# Patient Record
Sex: Female | Born: 1950 | Race: White | Marital: Single | Smoking: Never smoker
Health system: Southern US, Community
[De-identification: ages and names within clinical notes are randomized; demographics above are authoritative.]

---

## 2021-04-22 ENCOUNTER — Other Ambulatory Visit: Payer: Self-pay | Admitting: Internal Medicine

## 2021-04-22 DIAGNOSIS — E785 Hyperlipidemia, unspecified: Secondary | ICD-10-CM

## 2021-05-15 ENCOUNTER — Other Ambulatory Visit: Payer: Self-pay

## 2021-05-15 ENCOUNTER — Ambulatory Visit
Admission: RE | Admit: 2021-05-15 | Discharge: 2021-05-15 | Disposition: A | Discharge status: Home / Self Care | Payer: No Typology Code available for payment source | Source: Ambulatory Visit | Attending: Internal Medicine | Admitting: Internal Medicine

## 2021-05-15 DIAGNOSIS — E785 Hyperlipidemia, unspecified: Secondary | ICD-10-CM

## 2021-05-30 ENCOUNTER — Emergency Department (HOSPITAL_BASED_OUTPATIENT_CLINIC_OR_DEPARTMENT_OTHER): Payer: Medicare Other

## 2021-05-30 ENCOUNTER — Encounter (HOSPITAL_BASED_OUTPATIENT_CLINIC_OR_DEPARTMENT_OTHER): Payer: Self-pay | Admitting: Emergency Medicine

## 2021-05-30 ENCOUNTER — Emergency Department (HOSPITAL_BASED_OUTPATIENT_CLINIC_OR_DEPARTMENT_OTHER)
Admission: EM | Admit: 2021-05-30 | Discharge: 2021-05-31 | Disposition: A | Discharge status: Home / Self Care | Payer: Medicare Other | Attending: Emergency Medicine | Admitting: Emergency Medicine

## 2021-05-30 ENCOUNTER — Other Ambulatory Visit: Payer: Self-pay

## 2021-05-30 DIAGNOSIS — N130 Hydronephrosis with ureteropelvic junction obstruction: Secondary | ICD-10-CM | POA: Diagnosis not present

## 2021-05-30 DIAGNOSIS — K922 Gastrointestinal hemorrhage, unspecified: Secondary | ICD-10-CM

## 2021-05-30 DIAGNOSIS — R195 Other fecal abnormalities: Secondary | ICD-10-CM | POA: Diagnosis present

## 2021-05-30 DIAGNOSIS — R1084 Generalized abdominal pain: Secondary | ICD-10-CM

## 2021-05-30 DIAGNOSIS — R300 Dysuria: Secondary | ICD-10-CM

## 2021-05-30 DIAGNOSIS — N135 Crossing vessel and stricture of ureter without hydronephrosis: Secondary | ICD-10-CM

## 2021-05-30 LAB — BASIC METABOLIC PANEL
Anion gap: 5 (ref 5–15)
BUN: 17 mg/dL (ref 8–23)
CO2: 31 mmol/L (ref 22–32)
Calcium: 9.4 mg/dL (ref 8.9–10.3)
Chloride: 103 mmol/L (ref 98–111)
Creatinine, Ser: 0.88 mg/dL (ref 0.44–1.00)
GFR, Estimated: >60 mL/min (ref >60)
Glucose, Bld: 79 mg/dL (ref 70–99)
Potassium: 4.2 mmol/L (ref 3.5–5.1)
Sodium: 139 mmol/L (ref 135–145)

## 2021-05-30 LAB — URINALYSIS, ROUTINE W REFLEX MICROSCOPIC
Bilirubin Urine: NEGATIVE
Glucose, UA: NEGATIVE mg/dL
Hgb urine dipstick: NEGATIVE
Ketones, ur: NEGATIVE mg/dL
Leukocytes,Ua: NEGATIVE
Nitrite: NEGATIVE
Protein, ur: NEGATIVE mg/dL
Specific Gravity, Urine: 1.009 (ref 1.005–1.030)
pH: 7 (ref 5.0–8.0)

## 2021-05-30 LAB — CBC WITH DIFFERENTIAL/PLATELET
Abs Immature Granulocytes: 0 10*3/uL (ref 0.00–0.07)
Basophils Absolute: 0 10*3/uL (ref 0.0–0.1)
Basophils Relative: 1 %
Eosinophils Absolute: 0.2 10*3/uL (ref 0.0–0.5)
Eosinophils Relative: 3 %
HCT: 38.6 % (ref 36.0–46.0)
Hemoglobin: 13.1 g/dL (ref 12.0–15.0)
Immature Granulocytes: 0 %
Lymphocytes Relative: 45 %
Lymphs Abs: 2.4 10*3/uL (ref 0.7–4.0)
MCH: 31.3 pg (ref 26.0–34.0)
MCHC: 33.9 g/dL (ref 30.0–36.0)
MCV: 92.1 fL (ref 80.0–100.0)
Monocytes Absolute: 0.4 10*3/uL (ref 0.1–1.0)
Monocytes Relative: 7 %
Neutro Abs: 2.3 10*3/uL (ref 1.7–7.7)
Neutrophils Relative %: 44 %
Platelets: 218 10*3/uL (ref 150–400)
RBC: 4.19 MIL/uL (ref 3.87–5.11)
RDW: 12.5 % (ref 11.5–15.5)
WBC: 5.3 10*3/uL (ref 4.0–10.5)
nRBC: 0 % (ref 0.0–0.2)

## 2021-05-30 LAB — OCCULT BLOOD X 1 CARD TO LAB, STOOL: Fecal Occult Bld: NEGATIVE

## 2021-05-30 NOTE — ED Provider Notes (Signed)
11:50 PM Assumed care from Dr. Deretha Emory, please see their note for full history, physical and decision making until this point. In brief this is a 70 y.o. year old female who presented to the ED tonight with Blood In Stools     Possibly mucousy bloody stool at home. Plan for CT. Hb stable. POC pending. Likely discharge unless ct super abnormal.   CT reassuring from GI standpoint. 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 Will refer to PCP (message sent in Epic) for further workup of this and management. Kidneys working fine. Urine clear, no indication for emergent [place] or urology consult at this time.   Discharge instructions, including strict return precautions for new or worsening symptoms, given. Patient and/or family verbalized understanding and agreement with the plan as described.   Labs, studies and imaging reviewed by myself and considered in medical decision making if ordered. Imaging interpreted by radiology.  Labs Reviewed  URINALYSIS, ROUTINE W REFLEX MICROSCOPIC - Abnormal; Notable for the following components:      Result Value   Color, Urine COLORLESS (*)    All other components within normal limits  CBC WITH DIFFERENTIAL/PLATELET  BASIC METABOLIC PANEL  HEPATIC FUNCTION PANEL  LIPASE, BLOOD  OCCULT BLOOD X 1 CARD TO LAB, STOOL    CT Abdomen Pelvis W Contrast    (Results Pending)    No follow-ups on file.    Jasmine Memos, MD 05/31/21 [phone]

## 2021-05-30 NOTE — ED Notes (Signed)
Pt declined to change into a gown a this time for this RN to complete her assessment. Pt does not want a rectal exam done because she brought a sample of her stool and does not understand why we cannot use that sample, this RN explained the protocol and the patient verbalized understanding.

## 2021-05-30 NOTE — ED Triage Notes (Signed)
Pt arrives pov with referral from PCP with c/o dark stool x 2 days and dysuria

## 2021-05-30 NOTE — ED Provider Notes (Signed)
[facility] EMERGENCY DEPT Provider Note   CSN: [id number] Arrival date & time: 05/30/21  1447     History Chief Complaint  Patient presents with   Blood In Stools    Jasmine Hughes is a 70 y.o. female.  Patient new to the area followed by [facility]  Patient states stating yesterday started having black mucousy and red stool.  Some of it would disperse into the toilet water but cold water would not turn all red.  Patient also had some red blood on wiping.  Patient brought stool sample in.  No gross melena or gross blood noted.  Sent for Hemoccult.  Patient without any distinct abdominal discomfort.  But does have a history of reflux and hiatal hernia.  But patient's never had blood in bowel movements before.  Certainly never had mucousy bowel movements before.  Denies any nausea vomiting or diarrhea no fevers.  And also concerned about urinary tract infection she has dysuria.  Patient states she last had a colonoscopy 5 years ago.  She was not living here at that time.  Does not have a gastroenterologist locally.      History reviewed. No pertinent past medical history.  There are no problems to display for this patient.   History reviewed. No pertinent surgical history.   OB History   No obstetric history on file.     History reviewed. No pertinent family history.  Social History   Tobacco Use   Smoking status: Never  Vaping Use   Vaping Use: Never used  Substance Use Topics   Alcohol use: Yes    Comment: occ    Home Medications Prior to Admission medications   Not on File    Allergies    Diphenhydramine, Diphenhydramine hcl, and Amoxicillin  Review of Systems   Review of Systems  Constitutional:  Negative for chills and fever.  HENT:  Negative for ear pain and sore throat.   Eyes:  Negative for pain and visual disturbance.  Respiratory:  Negative for cough and shortness of breath.   Cardiovascular:  Negative for chest pain  and palpitations.  Gastrointestinal:  Positive for abdominal pain and blood in stool. Negative for diarrhea, nausea and vomiting.  Genitourinary:  Positive for dysuria. Negative for hematuria.  Musculoskeletal:  Negative for arthralgias and back pain.  Skin:  Negative for color change and rash.  Neurological:  Negative for seizures and syncope.  All other systems reviewed and are negative.  Physical Exam Updated Vital Signs BP 136/80    Pulse 61    Temp 98.5 F (36.9 C) (Oral)    Resp 20    Ht 1.6 m (5\' 3" )    Wt 68 kg    SpO2 96%    BMI 26.57 kg/m   Physical Exam Vitals and nursing note reviewed.  Constitutional:      General: She is not in acute distress.    Appearance: Normal appearance. She is well-developed.  HENT:     Head: Normocephalic and atraumatic.  Eyes:     Extraocular Movements: Extraocular movements intact.     Conjunctiva/sclera: Conjunctivae normal.     Pupils: Pupils are equal, round, and reactive to light.  Cardiovascular:     Rate and Rhythm: Normal rate and regular rhythm.     Heart sounds: No murmur heard. Pulmonary:     Effort: Pulmonary effort is normal. No respiratory distress.     Breath sounds: Normal breath sounds.  Abdominal:  General: There is no distension.     Palpations: Abdomen is soft.     Tenderness: There is no abdominal tenderness. There is no guarding.  Musculoskeletal:        General: No swelling.     Cervical back: Neck supple.  Skin:    General: Skin is warm and dry.     Capillary Refill: Capillary refill takes less than 2 seconds.  Neurological:     Mental Status: She is alert.  Psychiatric:        Mood and Affect: Mood normal.    ED Results / Procedures / Treatments   Labs (all labs ordered are listed, but only abnormal results are displayed) Labs Reviewed  URINALYSIS, ROUTINE W REFLEX MICROSCOPIC - Abnormal; Notable for the following components:      Result Value   Color, Urine COLORLESS (*)    All other components  within normal limits  CBC WITH DIFFERENTIAL/PLATELET  BASIC METABOLIC PANEL  HEPATIC FUNCTION PANEL  LIPASE, BLOOD  POC OCCULT BLOOD, ED    EKG None  Radiology No results found.  Procedures Procedures   Medications Ordered in ED Medications - No data to display  ED Course  I have reviewed the triage vital signs and the nursing notes.  Pertinent labs & imaging results that were available during my care of the patient were reviewed by me and considered in my medical decision making (see chart for details).    MDM Rules/Calculators/A&P                          Patient's vital signs here are very reassuring not tachycardic no hypotension.  The stool that is available here is dark brown.  No gross blood.  But Hemoccult is pending.  Urinalysis is normal.  No evidence of urinary tract infection.  No leukocytosis on the CBC hemoglobin is 13.1.  ASIC metabolic panel is normal.  We will add on hepatic and lipase because of her symptoms.  Will get CT scan of her abdomen and pelvis.  If no acute findings patient can be referred for follow-up with gastroenterology.  [facility] is covering.  And also patient can follow back up with primary care provider for follow-up of hemoglobin.  At this point unless CT directs otherwise patient does not have an indication for admission.  Final Clinical Impression(s) / ED Diagnoses Final diagnoses:  Gastrointestinal hemorrhage, unspecified gastrointestinal hemorrhage type  Generalized abdominal pain  Dysuria    Rx / DC Orders ED Discharge Orders     None        Fredia Sorrow, MD 05/30/21 [phone]

## 2021-05-30 NOTE — Discharge Instructions (Addendum)
Make an appointment to follow-up with gastroenterology.  Return for water in the commode turning completely red x2 in a day.  This would be evidence of a significant bleed.  Or for any new or worse abdominal pain.  Also could follow back up with your primary care doctor next week for hemoglobin check.  Gastroenterology probably needs to consider arranging colonoscopy and may be upper endoscopy.  Your CT scan showed an obstruction at the right kidney of unclear cause. Please follow up with your PCP for further workup and management of this.

## 2021-05-31 LAB — HEPATIC FUNCTION PANEL
ALT: 13 U/L (ref 0–44)
AST: 18 U/L (ref 15–41)
Albumin: 4 g/dL (ref 3.5–5.0)
Alkaline Phosphatase: 44 U/L (ref 38–126)
Bilirubin, Direct: 0.1 mg/dL (ref 0.0–0.2)
Indirect Bilirubin: 0.5 mg/dL (ref 0.3–0.9)
Total Bilirubin: 0.6 mg/dL (ref 0.3–1.2)
Total Protein: 7 g/dL (ref 6.5–8.1)

## 2021-05-31 LAB — LIPASE, BLOOD: Lipase: 21 U/L (ref 11–51)

## 2021-05-31 MED ORDER — IOHEXOL 300 MG/ML  SOLN
100.0000 mL | Freq: Once | INTRAMUSCULAR | Status: AC | PRN
  Administered 2021-05-31: 100 mL via INTRAVENOUS

## 2021-06-23 ENCOUNTER — Other Ambulatory Visit: Payer: Self-pay | Admitting: Internal Medicine

## 2021-06-23 DIAGNOSIS — N135 Crossing vessel and stricture of ureter without hydronephrosis: Secondary | ICD-10-CM

## 2021-07-04 ENCOUNTER — Ambulatory Visit
Admission: RE | Admit: 2021-07-04 | Discharge: 2021-07-04 | Disposition: A | Discharge status: Home / Self Care | Payer: Medicare Other | Source: Ambulatory Visit | Attending: Internal Medicine | Admitting: Internal Medicine

## 2021-07-04 DIAGNOSIS — N135 Crossing vessel and stricture of ureter without hydronephrosis: Secondary | ICD-10-CM

## 2021-07-04 DIAGNOSIS — N1330 Unspecified hydronephrosis: Secondary | ICD-10-CM | POA: Diagnosis not present

## 2021-07-08 DIAGNOSIS — K921 Melena: Secondary | ICD-10-CM | POA: Diagnosis not present

## 2021-07-22 DIAGNOSIS — R87810 Cervical high risk human papillomavirus (HPV) DNA test positive: Secondary | ICD-10-CM | POA: Diagnosis not present

## 2021-08-06 DIAGNOSIS — N281 Cyst of kidney, acquired: Secondary | ICD-10-CM | POA: Diagnosis not present

## 2021-08-12 DIAGNOSIS — H524 Presbyopia: Secondary | ICD-10-CM | POA: Diagnosis not present

## 2021-08-29 DIAGNOSIS — M549 Dorsalgia, unspecified: Secondary | ICD-10-CM | POA: Diagnosis not present

## 2021-09-05 DIAGNOSIS — M9902 Segmental and somatic dysfunction of thoracic region: Secondary | ICD-10-CM | POA: Diagnosis not present

## 2021-09-05 DIAGNOSIS — M5032 Other cervical disc degeneration, mid-cervical region, unspecified level: Secondary | ICD-10-CM | POA: Diagnosis not present

## 2021-09-05 DIAGNOSIS — M9901 Segmental and somatic dysfunction of cervical region: Secondary | ICD-10-CM | POA: Diagnosis not present

## 2021-09-05 DIAGNOSIS — M47814 Spondylosis without myelopathy or radiculopathy, thoracic region: Secondary | ICD-10-CM | POA: Diagnosis not present

## 2021-09-09 DIAGNOSIS — M47814 Spondylosis without myelopathy or radiculopathy, thoracic region: Secondary | ICD-10-CM | POA: Diagnosis not present

## 2021-09-09 DIAGNOSIS — M9902 Segmental and somatic dysfunction of thoracic region: Secondary | ICD-10-CM | POA: Diagnosis not present

## 2021-09-09 DIAGNOSIS — M5032 Other cervical disc degeneration, mid-cervical region, unspecified level: Secondary | ICD-10-CM | POA: Diagnosis not present

## 2021-09-09 DIAGNOSIS — M9901 Segmental and somatic dysfunction of cervical region: Secondary | ICD-10-CM | POA: Diagnosis not present

## 2021-09-11 DIAGNOSIS — M47814 Spondylosis without myelopathy or radiculopathy, thoracic region: Secondary | ICD-10-CM | POA: Diagnosis not present

## 2021-09-11 DIAGNOSIS — M9902 Segmental and somatic dysfunction of thoracic region: Secondary | ICD-10-CM | POA: Diagnosis not present

## 2021-09-11 DIAGNOSIS — M9901 Segmental and somatic dysfunction of cervical region: Secondary | ICD-10-CM | POA: Diagnosis not present

## 2021-09-11 DIAGNOSIS — M5032 Other cervical disc degeneration, mid-cervical region, unspecified level: Secondary | ICD-10-CM | POA: Diagnosis not present

## 2021-09-15 DIAGNOSIS — M9901 Segmental and somatic dysfunction of cervical region: Secondary | ICD-10-CM | POA: Diagnosis not present

## 2021-09-15 DIAGNOSIS — M47814 Spondylosis without myelopathy or radiculopathy, thoracic region: Secondary | ICD-10-CM | POA: Diagnosis not present

## 2021-09-15 DIAGNOSIS — M5032 Other cervical disc degeneration, mid-cervical region, unspecified level: Secondary | ICD-10-CM | POA: Diagnosis not present

## 2021-09-15 DIAGNOSIS — M9902 Segmental and somatic dysfunction of thoracic region: Secondary | ICD-10-CM | POA: Diagnosis not present

## 2021-09-19 DIAGNOSIS — M9901 Segmental and somatic dysfunction of cervical region: Secondary | ICD-10-CM | POA: Diagnosis not present

## 2021-09-19 DIAGNOSIS — M9902 Segmental and somatic dysfunction of thoracic region: Secondary | ICD-10-CM | POA: Diagnosis not present

## 2021-09-19 DIAGNOSIS — M5032 Other cervical disc degeneration, mid-cervical region, unspecified level: Secondary | ICD-10-CM | POA: Diagnosis not present

## 2021-09-19 DIAGNOSIS — M47814 Spondylosis without myelopathy or radiculopathy, thoracic region: Secondary | ICD-10-CM | POA: Diagnosis not present

## 2021-09-22 DIAGNOSIS — M9901 Segmental and somatic dysfunction of cervical region: Secondary | ICD-10-CM | POA: Diagnosis not present

## 2021-09-22 DIAGNOSIS — M47814 Spondylosis without myelopathy or radiculopathy, thoracic region: Secondary | ICD-10-CM | POA: Diagnosis not present

## 2021-09-22 DIAGNOSIS — M5032 Other cervical disc degeneration, mid-cervical region, unspecified level: Secondary | ICD-10-CM | POA: Diagnosis not present

## 2021-09-22 DIAGNOSIS — M9902 Segmental and somatic dysfunction of thoracic region: Secondary | ICD-10-CM | POA: Diagnosis not present

## 2021-09-26 DIAGNOSIS — N9089 Other specified noninflammatory disorders of vulva and perineum: Secondary | ICD-10-CM | POA: Diagnosis not present

## 2021-09-26 DIAGNOSIS — A609 Anogenital herpesviral infection, unspecified: Secondary | ICD-10-CM | POA: Diagnosis not present

## 2021-10-07 DIAGNOSIS — K921 Melena: Secondary | ICD-10-CM | POA: Diagnosis not present

## 2021-10-07 DIAGNOSIS — K2930 Chronic superficial gastritis without bleeding: Secondary | ICD-10-CM | POA: Diagnosis not present

## 2021-10-07 DIAGNOSIS — K635 Polyp of colon: Secondary | ICD-10-CM | POA: Diagnosis not present

## 2021-10-07 DIAGNOSIS — K449 Diaphragmatic hernia without obstruction or gangrene: Secondary | ICD-10-CM | POA: Diagnosis not present

## 2021-10-07 DIAGNOSIS — K625 Hemorrhage of anus and rectum: Secondary | ICD-10-CM | POA: Diagnosis not present

## 2021-10-07 DIAGNOSIS — K222 Esophageal obstruction: Secondary | ICD-10-CM | POA: Diagnosis not present

## 2021-10-07 DIAGNOSIS — K5730 Diverticulosis of large intestine without perforation or abscess without bleeding: Secondary | ICD-10-CM | POA: Diagnosis not present

## 2021-10-07 DIAGNOSIS — K648 Other hemorrhoids: Secondary | ICD-10-CM | POA: Diagnosis not present

## 2021-10-07 DIAGNOSIS — K317 Polyp of stomach and duodenum: Secondary | ICD-10-CM | POA: Diagnosis not present

## 2021-10-07 DIAGNOSIS — K2970 Gastritis, unspecified, without bleeding: Secondary | ICD-10-CM | POA: Diagnosis not present

## 2021-10-10 DIAGNOSIS — K317 Polyp of stomach and duodenum: Secondary | ICD-10-CM | POA: Diagnosis not present

## 2021-10-10 DIAGNOSIS — K2930 Chronic superficial gastritis without bleeding: Secondary | ICD-10-CM | POA: Diagnosis not present

## 2021-10-10 DIAGNOSIS — K635 Polyp of colon: Secondary | ICD-10-CM | POA: Diagnosis not present

## 2021-10-23 DIAGNOSIS — M5032 Other cervical disc degeneration, mid-cervical region, unspecified level: Secondary | ICD-10-CM | POA: Diagnosis not present

## 2021-10-23 DIAGNOSIS — M47814 Spondylosis without myelopathy or radiculopathy, thoracic region: Secondary | ICD-10-CM | POA: Diagnosis not present

## 2021-10-23 DIAGNOSIS — M9902 Segmental and somatic dysfunction of thoracic region: Secondary | ICD-10-CM | POA: Diagnosis not present

## 2021-10-23 DIAGNOSIS — M9901 Segmental and somatic dysfunction of cervical region: Secondary | ICD-10-CM | POA: Diagnosis not present

## 2021-10-28 DIAGNOSIS — E785 Hyperlipidemia, unspecified: Secondary | ICD-10-CM | POA: Diagnosis not present

## 2021-10-28 DIAGNOSIS — R7989 Other specified abnormal findings of blood chemistry: Secondary | ICD-10-CM | POA: Diagnosis not present

## 2021-10-28 DIAGNOSIS — E559 Vitamin D deficiency, unspecified: Secondary | ICD-10-CM | POA: Diagnosis not present

## 2021-11-03 DIAGNOSIS — Z0000 Encounter for general adult medical examination without abnormal findings: Secondary | ICD-10-CM | POA: Diagnosis not present

## 2021-11-03 DIAGNOSIS — G4700 Insomnia, unspecified: Secondary | ICD-10-CM | POA: Diagnosis not present

## 2021-11-03 DIAGNOSIS — E559 Vitamin D deficiency, unspecified: Secondary | ICD-10-CM | POA: Diagnosis not present

## 2021-11-03 DIAGNOSIS — R82998 Other abnormal findings in urine: Secondary | ICD-10-CM | POA: Diagnosis not present

## 2021-11-03 DIAGNOSIS — E785 Hyperlipidemia, unspecified: Secondary | ICD-10-CM | POA: Diagnosis not present

## 2021-11-15 ENCOUNTER — Emergency Department (HOSPITAL_BASED_OUTPATIENT_CLINIC_OR_DEPARTMENT_OTHER)
Admission: EM | Admit: 2021-11-15 | Discharge: 2021-11-16 | Disposition: A | Discharge status: Home / Self Care | Payer: Medicare Other | Attending: Emergency Medicine | Admitting: Emergency Medicine

## 2021-11-15 ENCOUNTER — Encounter (HOSPITAL_BASED_OUTPATIENT_CLINIC_OR_DEPARTMENT_OTHER): Payer: Self-pay | Admitting: *Deleted

## 2021-11-15 ENCOUNTER — Other Ambulatory Visit: Payer: Self-pay

## 2021-11-15 DIAGNOSIS — S50862A Insect bite (nonvenomous) of left forearm, initial encounter: Secondary | ICD-10-CM | POA: Insufficient documentation

## 2021-11-15 DIAGNOSIS — W57XXXA Bitten or stung by nonvenomous insect and other nonvenomous arthropods, initial encounter: Secondary | ICD-10-CM | POA: Diagnosis not present

## 2021-11-15 DIAGNOSIS — S59912A Unspecified injury of left forearm, initial encounter: Secondary | ICD-10-CM | POA: Diagnosis present

## 2021-11-15 NOTE — ED Triage Notes (Signed)
Pt states she was bitten by something on Wednesday. Not sure what bit her. Pt has not had fever or chills. States she is allergic to benadryl. Has put ice on site. States area has gotten "bigger" since Wednesday. Redness noted to anterior aspect of her left arm. Silver dollar in size.

## 2021-11-16 MED ORDER — HYDROCORTISONE 1 % EX CREA
TOPICAL_CREAM | Freq: Three times a day (TID) | CUTANEOUS | Status: DC
  Administered 2021-11-16: 1 via TOPICAL
  Filled 2021-11-16: qty 28

## 2021-11-16 NOTE — ED Provider Notes (Signed)
   [facility] Provider Note: Lowella Dell, MD, FACEP  CSN: [id number] MRN: [id number] ARRIVAL: 11/15/21 at 2101 ROOM: DB003/DB003   CHIEF COMPLAINT  Skin Problem   HISTORY OF PRESENT ILLNESS  11/16/21 4:36 AM Jasmine Hughes is a 71 y.o. female who thinks she was bitten by something (insect?)  On her anterior left arm 4 days ago.  She has a welt at the site which has increased in size.  She has not had fever or chills with this.  It is pruritic but minimally painful.  She has had no systemic symptoms such as fever, chills, nausea, vomiting, diarrhea, shortness of breath.  She has not taken any Benadryl because she is allergic to it.   History reviewed. No pertinent past medical history.  History reviewed. No pertinent surgical history.  No family history on file.  Social History   Tobacco Use   Smoking status: Never   Smokeless tobacco: Never  Vaping Use   Vaping Use: Never used  Substance Use Topics   Alcohol use: Yes    Comment: occ   Drug use: Never    Prior to Admission medications   Not on File    Allergies Diphenhydramine, Diphenhydramine hcl, and Amoxicillin   REVIEW OF SYSTEMS  Negative except as noted here or in the History of Present Illness.   PHYSICAL EXAMINATION  Initial Vital Signs Blood pressure 118/77, pulse 67, temperature 98.3 F (36.8 C), resp. rate 16, SpO2 97 %.  Examination General: Well-developed, well-nourished female in no acute distress; appearance consistent with age of record HENT: normocephalic; atraumatic Eyes: Normal appearance Neck: supple Heart: regular rate and rhythm Lungs: clear to auscultation bilaterally Abdomen: soft; nondistended; nontender; bowel sounds present Extremities: No deformity; full range of motion; pulses normal Neurologic: Awake, alert and oriented; motor function intact in all extremities and symmetric; no facial droop Skin: Warm and dry; mildly erythematous plaque left volar  forearm:    Psychiatric: Normal mood and affect   RESULTS  Summary of this visit's results, reviewed and interpreted by myself:   EKG Interpretation  Date/Time:    Ventricular Rate:    PR Interval:    QRS Duration:   QT Interval:    QTC Calculation:   R Axis:     Text Interpretation:         Laboratory Studies: No results found for this or any previous visit (from the past 24 hour(s)). Imaging Studies: No results found.  ED COURSE and MDM  Nursing notes, initial and subsequent vitals signs, including pulse oximetry, reviewed and interpreted by myself.  Vitals:   11/15/21 2210 11/15/21 2259 11/16/21 0429  BP: 123/85 138/78 118/77  Pulse: 70 67 67  Resp: 16 18 16   Temp: 98.3 F (36.8 C)    SpO2: 97% 98% 97%   Medications  hydrocortisone cream 1 % (has no administration in time range)   Lesion consistent with an insect or arthropod bite.  We will treat with topical hydrocortisone cream.  She is having no systemic symptoms to suggest a serious reaction.   PROCEDURES  Procedures   ED DIAGNOSES     ICD-10-CM   1. Arthropod bite of forearm, left, initial encounter  .C62.376E    G31, MD 11/16/21 [phone]

## 2021-11-16 NOTE — ED Notes (Signed)
Pt verbalizes understanding of discharge instructions. Opportunity for questioning and answers were provided. Pt discharged from ED to home.   ? ?

## 2021-12-29 DIAGNOSIS — N9089 Other specified noninflammatory disorders of vulva and perineum: Secondary | ICD-10-CM | POA: Diagnosis not present

## 2021-12-29 DIAGNOSIS — A609 Anogenital herpesviral infection, unspecified: Secondary | ICD-10-CM | POA: Diagnosis not present

## 2022-01-23 DIAGNOSIS — Z1231 Encounter for screening mammogram for malignant neoplasm of breast: Secondary | ICD-10-CM | POA: Diagnosis not present

## 2022-03-09 DIAGNOSIS — H5213 Myopia, bilateral: Secondary | ICD-10-CM | POA: Diagnosis not present

## 2022-03-09 DIAGNOSIS — H524 Presbyopia: Secondary | ICD-10-CM | POA: Diagnosis not present

## 2022-03-09 DIAGNOSIS — H25813 Combined forms of age-related cataract, bilateral: Secondary | ICD-10-CM | POA: Diagnosis not present

## 2022-03-09 DIAGNOSIS — H52203 Unspecified astigmatism, bilateral: Secondary | ICD-10-CM | POA: Diagnosis not present

## 2022-03-16 DIAGNOSIS — L821 Other seborrheic keratosis: Secondary | ICD-10-CM | POA: Diagnosis not present

## 2022-03-16 DIAGNOSIS — L578 Other skin changes due to chronic exposure to nonionizing radiation: Secondary | ICD-10-CM | POA: Diagnosis not present

## 2022-03-16 DIAGNOSIS — L814 Other melanin hyperpigmentation: Secondary | ICD-10-CM | POA: Diagnosis not present

## 2022-03-16 DIAGNOSIS — D225 Melanocytic nevi of trunk: Secondary | ICD-10-CM | POA: Diagnosis not present

## 2022-04-20 DIAGNOSIS — H02834 Dermatochalasis of left upper eyelid: Secondary | ICD-10-CM | POA: Diagnosis not present

## 2022-04-20 DIAGNOSIS — H02413 Mechanical ptosis of bilateral eyelids: Secondary | ICD-10-CM | POA: Diagnosis not present

## 2022-04-20 DIAGNOSIS — D485 Neoplasm of uncertain behavior of skin: Secondary | ICD-10-CM | POA: Diagnosis not present

## 2022-04-20 DIAGNOSIS — H02831 Dermatochalasis of right upper eyelid: Secondary | ICD-10-CM | POA: Diagnosis not present

## 2022-05-07 DIAGNOSIS — E785 Hyperlipidemia, unspecified: Secondary | ICD-10-CM | POA: Diagnosis not present

## 2022-06-02 DIAGNOSIS — K080 Exfoliation of teeth due to systemic causes: Secondary | ICD-10-CM | POA: Diagnosis not present

## 2022-06-05 DIAGNOSIS — D485 Neoplasm of uncertain behavior of skin: Secondary | ICD-10-CM | POA: Diagnosis not present

## 2022-06-05 DIAGNOSIS — L821 Other seborrheic keratosis: Secondary | ICD-10-CM | POA: Diagnosis not present

## 2022-06-26 DIAGNOSIS — L821 Other seborrheic keratosis: Secondary | ICD-10-CM | POA: Diagnosis not present

## 2022-06-26 DIAGNOSIS — H0279 Other degenerative disorders of eyelid and periocular area: Secondary | ICD-10-CM | POA: Diagnosis not present

## 2022-06-26 DIAGNOSIS — H02834 Dermatochalasis of left upper eyelid: Secondary | ICD-10-CM | POA: Diagnosis not present

## 2022-06-26 DIAGNOSIS — H02831 Dermatochalasis of right upper eyelid: Secondary | ICD-10-CM | POA: Diagnosis not present

## 2022-07-21 DIAGNOSIS — I781 Nevus, non-neoplastic: Secondary | ICD-10-CM | POA: Diagnosis not present

## 2022-07-21 DIAGNOSIS — L820 Inflamed seborrheic keratosis: Secondary | ICD-10-CM | POA: Diagnosis not present

## 2022-08-12 DIAGNOSIS — L820 Inflamed seborrheic keratosis: Secondary | ICD-10-CM | POA: Diagnosis not present

## 2022-09-24 DIAGNOSIS — L239 Allergic contact dermatitis, unspecified cause: Secondary | ICD-10-CM | POA: Diagnosis not present

## 2022-11-03 DIAGNOSIS — E785 Hyperlipidemia, unspecified: Secondary | ICD-10-CM | POA: Diagnosis not present

## 2022-11-03 DIAGNOSIS — E559 Vitamin D deficiency, unspecified: Secondary | ICD-10-CM | POA: Diagnosis not present

## 2022-11-10 DIAGNOSIS — Z1331 Encounter for screening for depression: Secondary | ICD-10-CM | POA: Diagnosis not present

## 2022-11-10 DIAGNOSIS — Z1339 Encounter for screening examination for other mental health and behavioral disorders: Secondary | ICD-10-CM | POA: Diagnosis not present

## 2022-11-10 DIAGNOSIS — R82998 Other abnormal findings in urine: Secondary | ICD-10-CM | POA: Diagnosis not present

## 2022-11-10 DIAGNOSIS — G4700 Insomnia, unspecified: Secondary | ICD-10-CM | POA: Diagnosis not present

## 2022-11-10 DIAGNOSIS — Z0000 Encounter for general adult medical examination without abnormal findings: Secondary | ICD-10-CM | POA: Diagnosis not present

## 2022-12-08 DIAGNOSIS — K080 Exfoliation of teeth due to systemic causes: Secondary | ICD-10-CM | POA: Diagnosis not present

## 2022-12-09 DIAGNOSIS — L239 Allergic contact dermatitis, unspecified cause: Secondary | ICD-10-CM | POA: Diagnosis not present

## 2022-12-16 DIAGNOSIS — K080 Exfoliation of teeth due to systemic causes: Secondary | ICD-10-CM | POA: Diagnosis not present

## 2022-12-30 DIAGNOSIS — L239 Allergic contact dermatitis, unspecified cause: Secondary | ICD-10-CM | POA: Diagnosis not present

## 2023-01-20 DIAGNOSIS — R87810 Cervical high risk human papillomavirus (HPV) DNA test positive: Secondary | ICD-10-CM | POA: Diagnosis not present

## 2023-01-20 DIAGNOSIS — Z01419 Encounter for gynecological examination (general) (routine) without abnormal findings: Secondary | ICD-10-CM | POA: Diagnosis not present

## 2023-01-26 IMAGING — CT CT CARDIAC CORONARY ARTERY CALCIUM SCORE
3 series · 14 of 20 positions shown, 16 images · non-contrast
Comparison: None.

CLINICAL DATA: Borderline hyperlipidemia

EXAM:
CT CARDIAC CORONARY ARTERY CALCIUM SCORE
TECHNIQUE: Non-contrast imaging through the heart was performed using
prospective ECG gating. Image post processing was performed on an
independent workstation, allowing for quantitative analysis of the
heart and coronary arteries. Note that this exam targets the heart
and the chest was not imaged in its entirety.

[Series 2: calcium scoring 2.00 qr36 bestdiast 70% hrt calciu · axial · 0.35mm/px · z∈[+1683,+1779]mm · 4 of 80 slices shown]
[im 16/80  vessel]
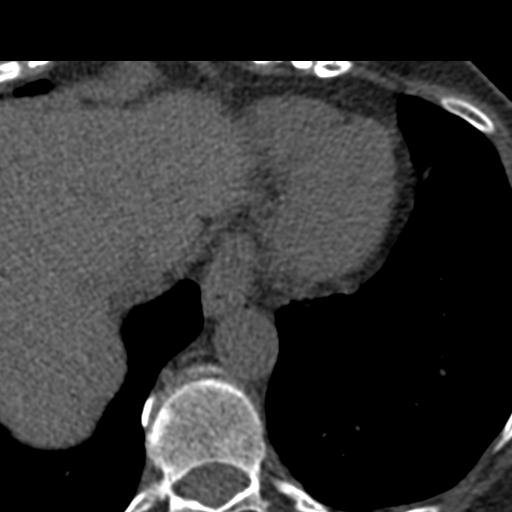
[im 32/80  vessel]
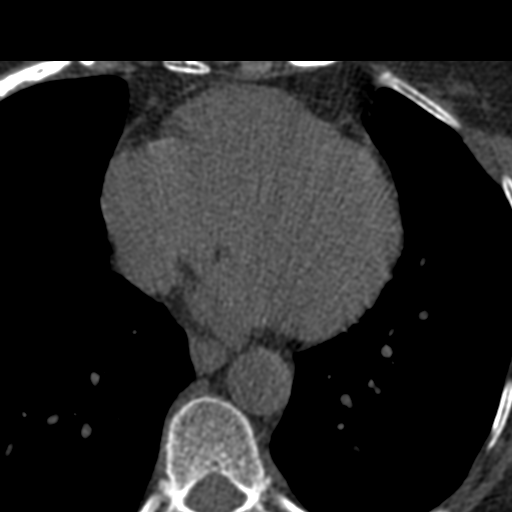
[im 48/80  vessel]
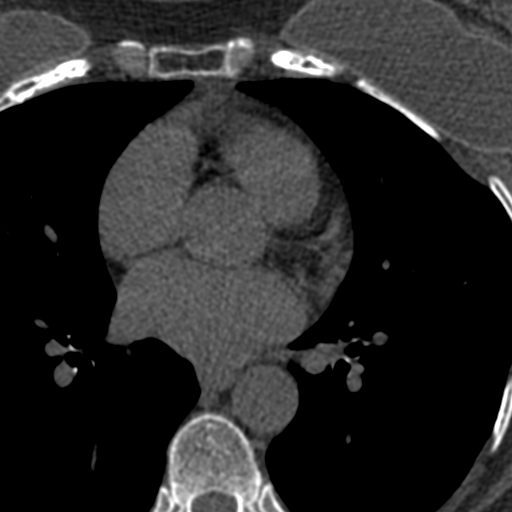
[im 64/80  vessel]
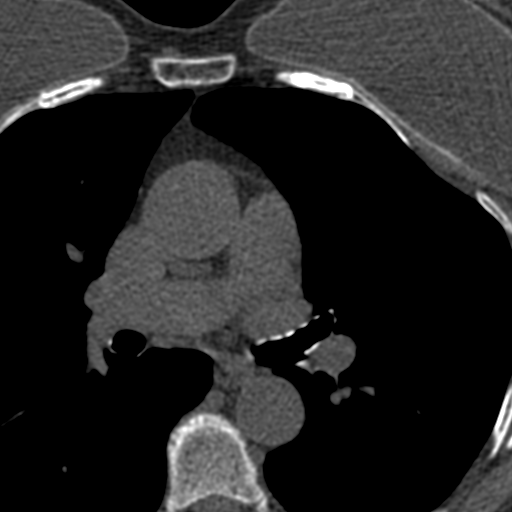

[Series 3: calcium scoring 2.00 br40 bestdiast 70% axial · axial · 0.56mm/px · z∈[+1679,+1783]mm · 5 of 80 slices shown, 7 images]
[im 14/80  vessel]
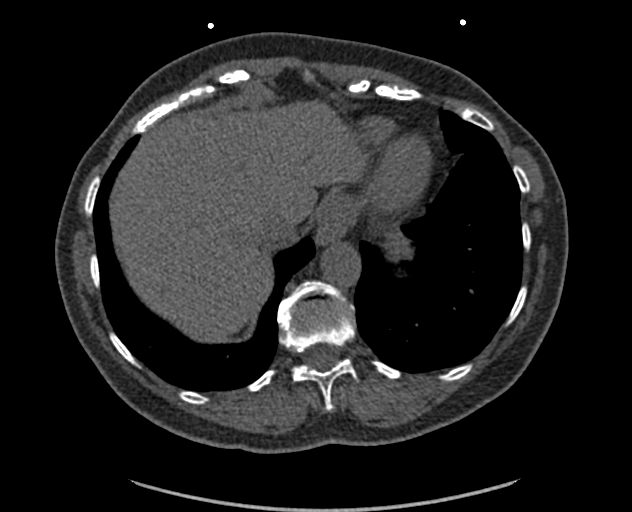
[im 14/80  lung]
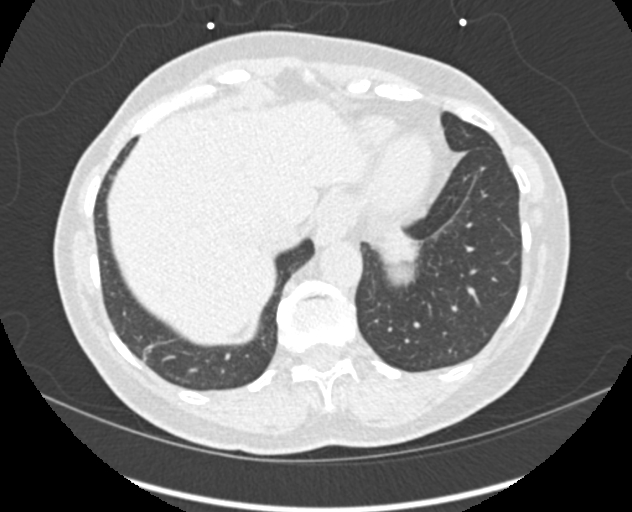
[im 27/80  vessel]
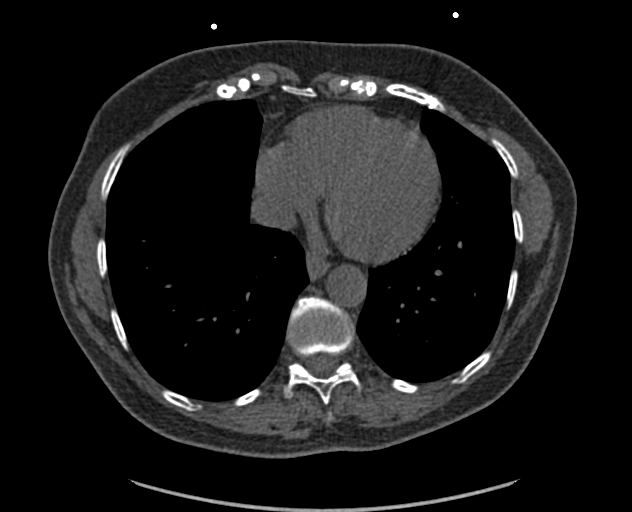
[im 40/80  vessel]
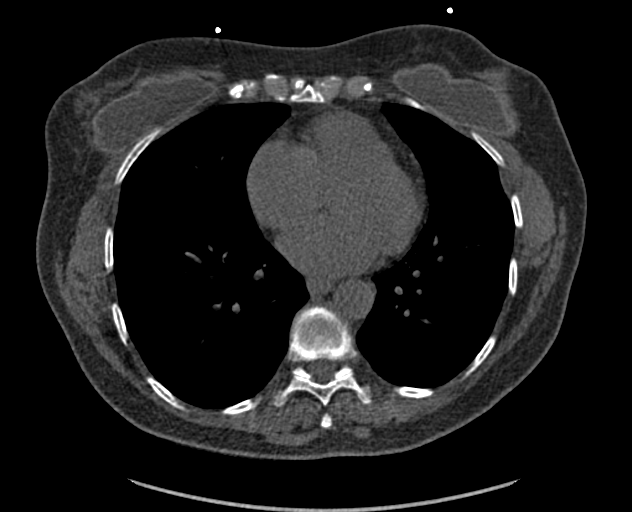
[im 53/80  vessel]
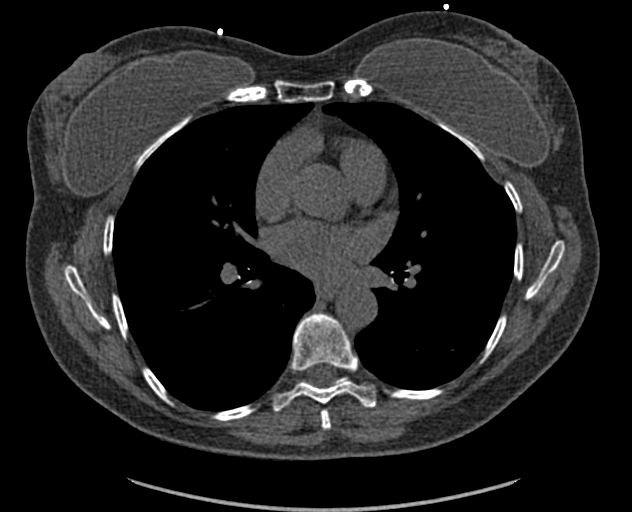
[im 66/80  vessel]
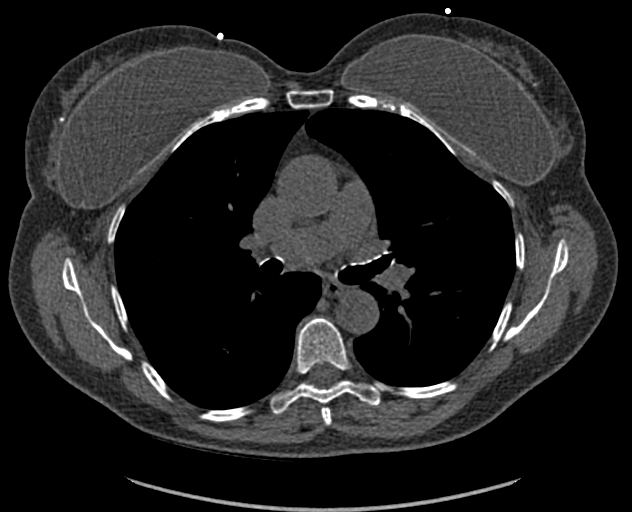
[im 66/80  lung]
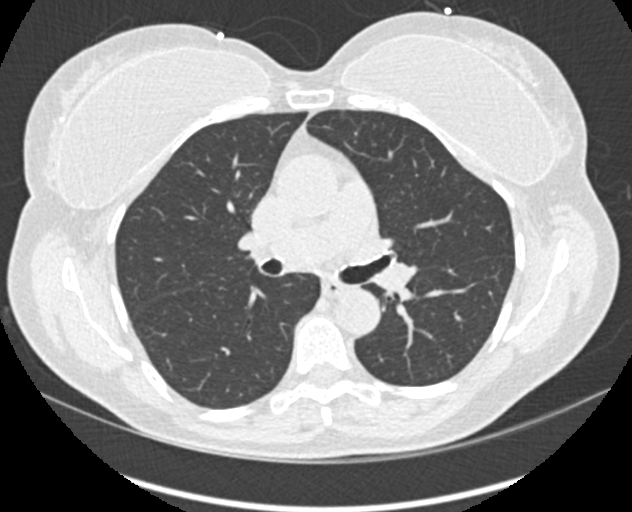

[Series 9: calcium scoring 2.00 br60 bestdiast 70% lungs · axial · 0.56mm/px · z∈[+1679,+1783]mm · 5 of 80 slices shown]
[im 14/80  vessel]
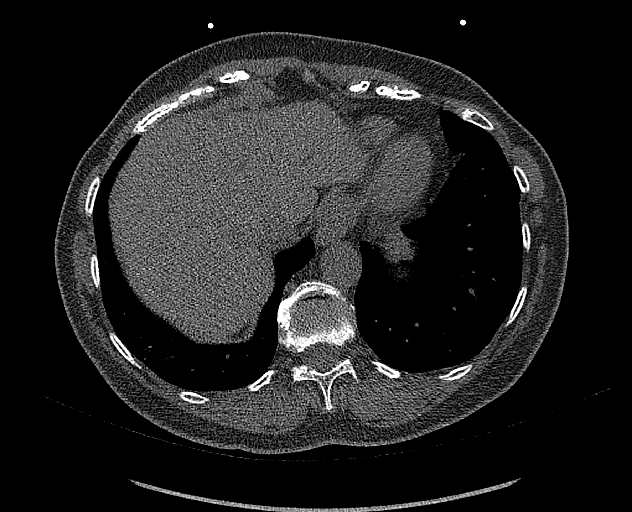
[im 27/80  vessel]
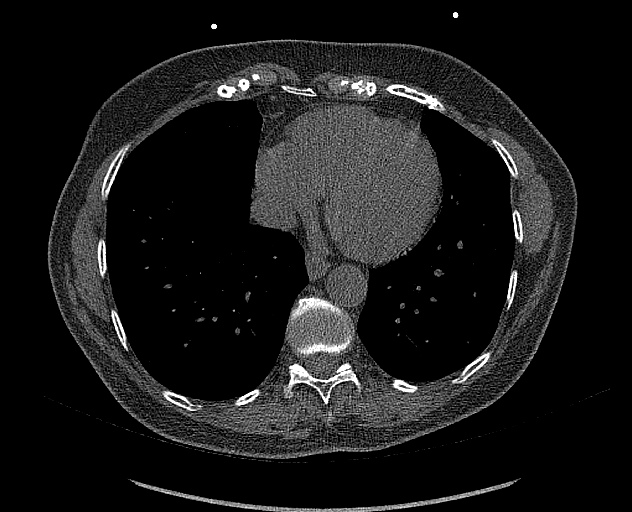
[im 40/80  vessel]
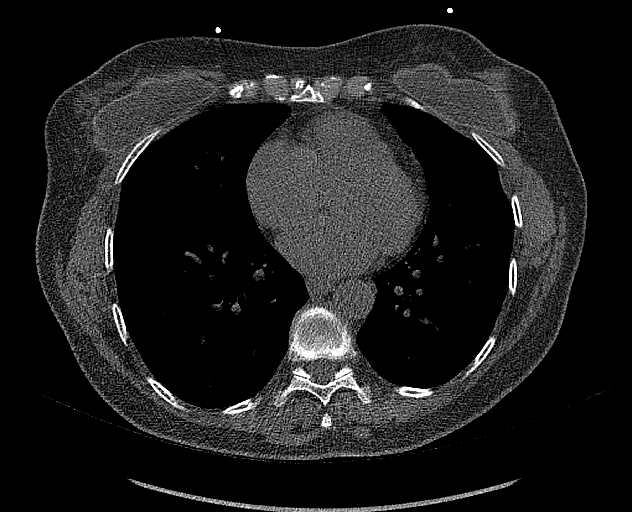
[im 53/80  vessel]
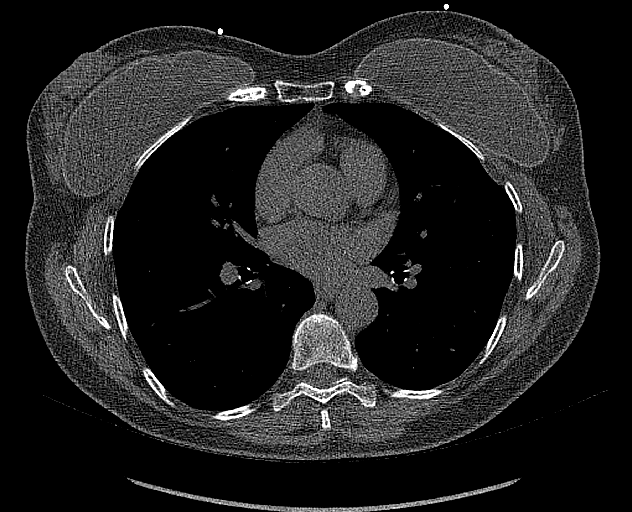
[im 66/80  vessel]
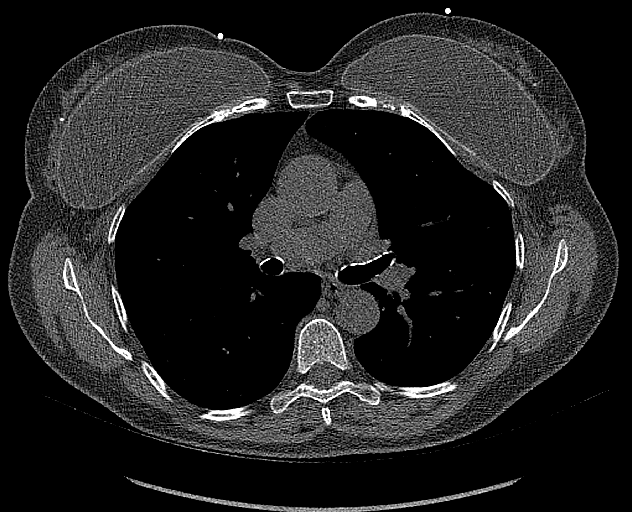

[14 of 20 positions shown; findings below may reference images not displayed]

FINDINGS: CORONARY CALCIUM SCORES:

Left Main: 0

LAD: 0

LCx: 0

RCA: 0

Total Agatston Score: 0

[HOSPITAL] percentile: 0

AORTA MEASUREMENTS:

Ascending Aorta: 33 mm

Descending Aorta: 22 mm

OTHER FINDINGS:

Heart is normal size. Aorta normal caliber. No adenopathy. No
confluent airspace opacities or effusions. Imaging into the upper
abdomen demonstrates no acute findings. Bilateral breast implants
partially imaged. Chest wall soft tissues grossly unremarkable. Mild
compression fracture through a lower thoracic vertebral body,
possibly T12.
IMPRESSION: No visible coronary artery calcifications. Total coronary calcium
score of 0.

No acute extra cardiac abnormality.

Age-indeterminate mild compression fracture in a lower thoracic
vertebral body, possibly T12.

## 2023-01-27 DIAGNOSIS — L239 Allergic contact dermatitis, unspecified cause: Secondary | ICD-10-CM | POA: Diagnosis not present

## 2023-02-10 DIAGNOSIS — Z1231 Encounter for screening mammogram for malignant neoplasm of breast: Secondary | ICD-10-CM | POA: Diagnosis not present

## 2023-02-10 DIAGNOSIS — R92333 Mammographic heterogeneous density, bilateral breasts: Secondary | ICD-10-CM | POA: Diagnosis not present

## 2023-02-10 IMAGING — CT CT ABD-PELV W/ CM
2 of 5 series · 16 of 46 positions shown, 18 images · IV contrast (omnipaque)
Comparison: None.

CLINICAL DATA: Abdominal pain, acute, nonlocalized. Melena.
Dysuria.

EXAM:
CT ABDOMEN AND PELVIS WITH CONTRAST
TECHNIQUE: Multidetector CT imaging of the abdomen and pelvis was performed
using the standard protocol following bolus administration of
intravenous contrast.
CONTRAST:  100mL OMNIPAQUE IOHEXOL 300 MG/ML  SOLN

[Series 2: abd pel w · axial · 0.70mm/px · z∈[+899,+1239]mm · 13 of 78 slices shown, 15 images]
[im 5/78  soft-tissue]
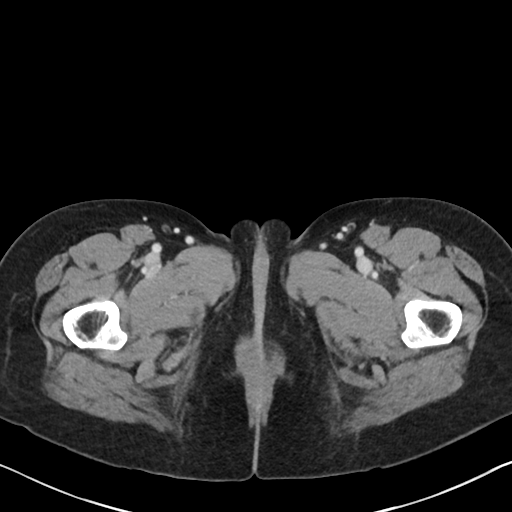
[im 5/78  bone]
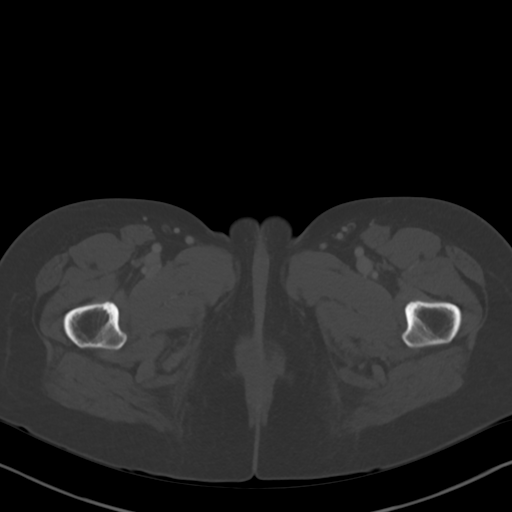
[im 9/78  soft-tissue]
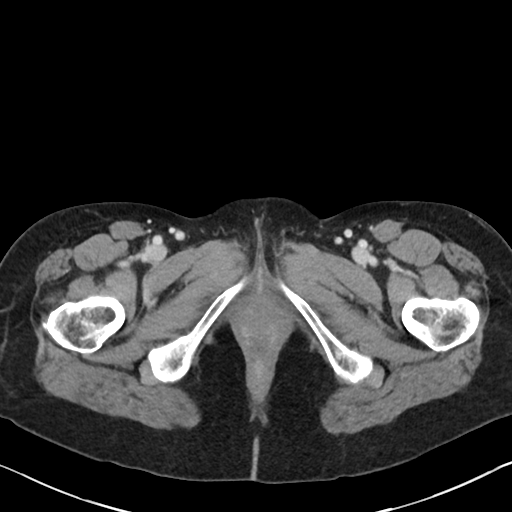
[im 18/78  soft-tissue]
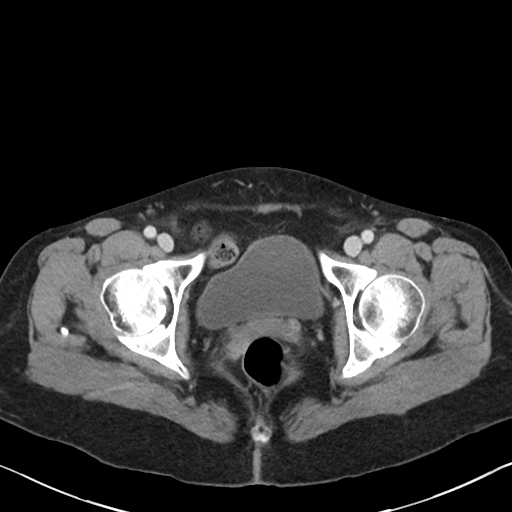
[im 22/78  soft-tissue]
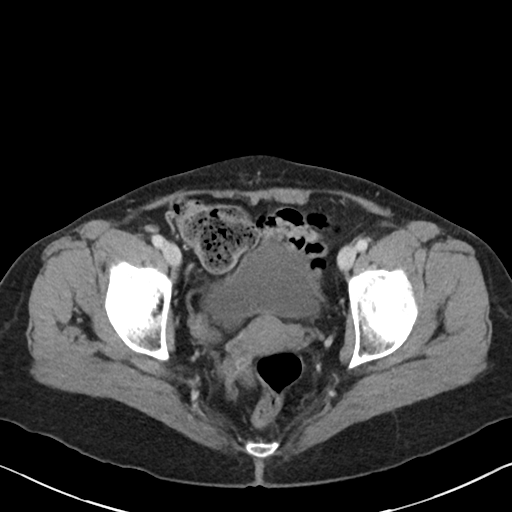
[im 26/78  soft-tissue]
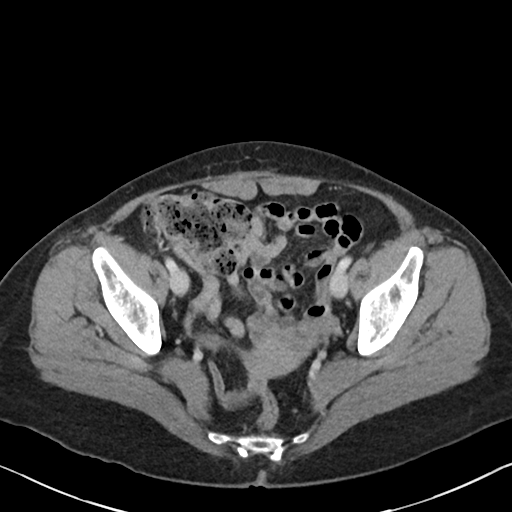
[im 35/78  soft-tissue]
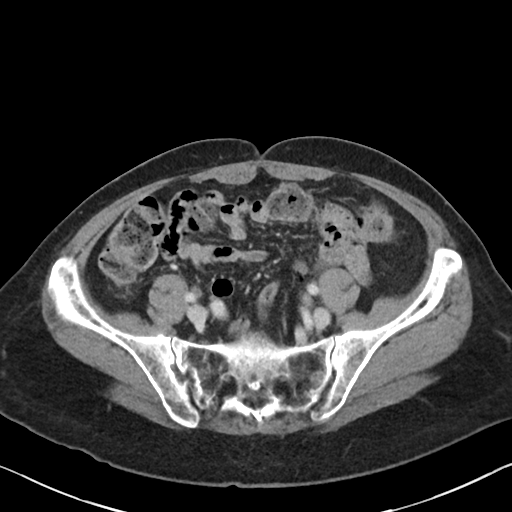
[im 39/78  soft-tissue]
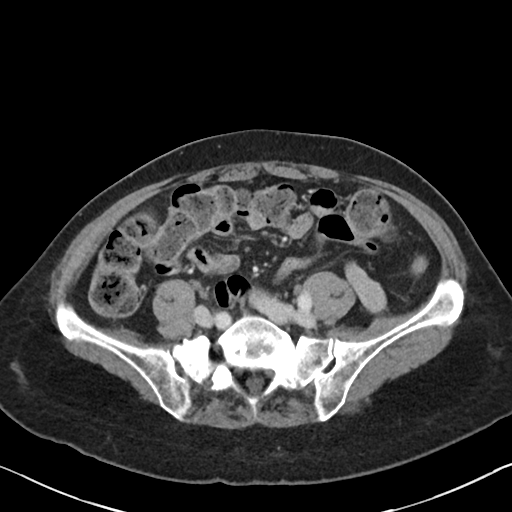
[im 43/78  soft-tissue]
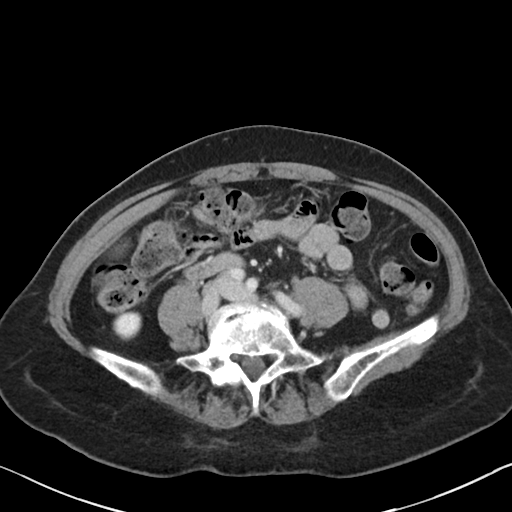
[im 52/78  soft-tissue]
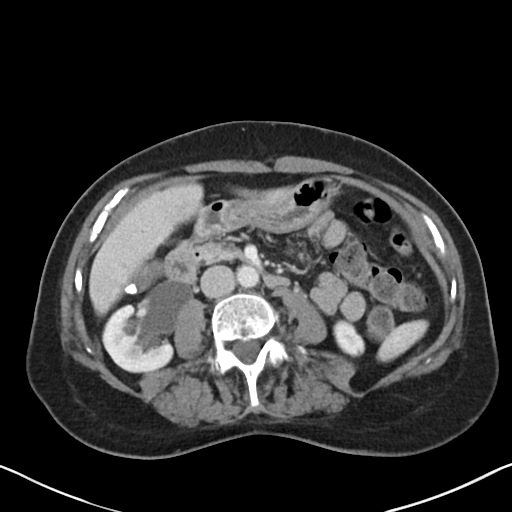
[im 52/78  bone]
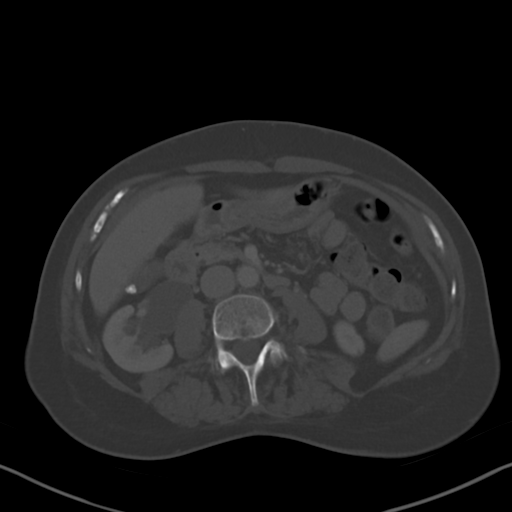
[im 56/78  soft-tissue]
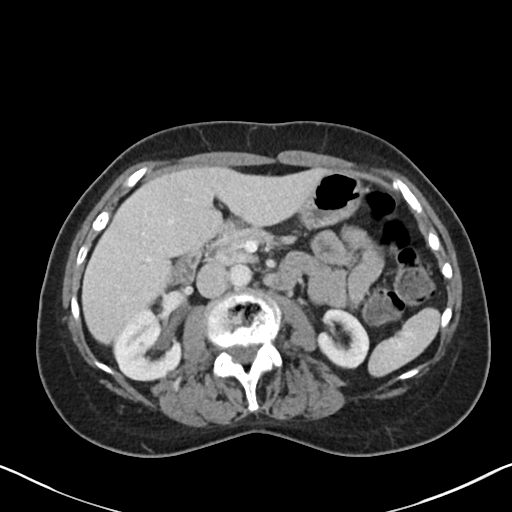
[im 60/78  soft-tissue]
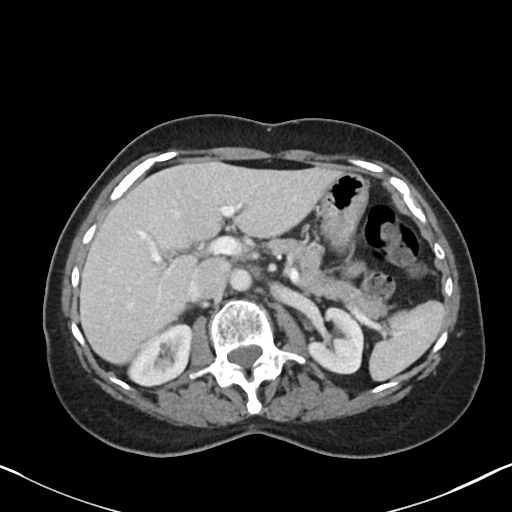
[im 69/78  soft-tissue]
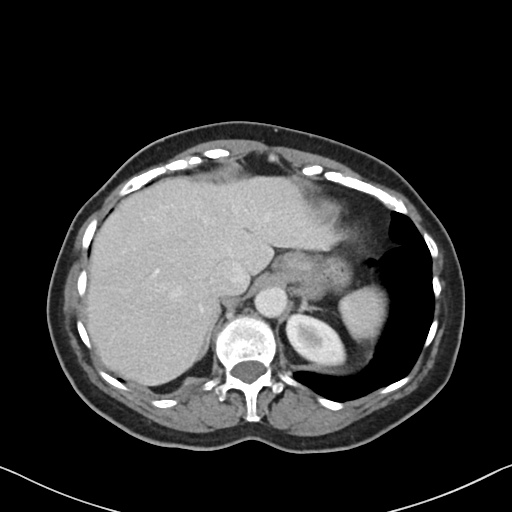
[im 73/78  soft-tissue]
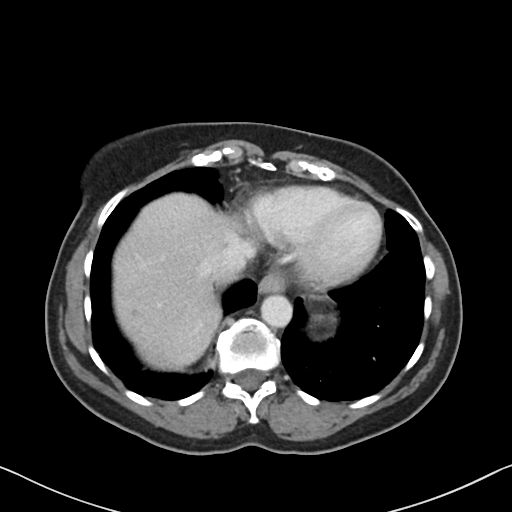

[Series 5: coronal · coronal · 0.71mm/px · 3 of 88 slices shown]
[im 30/88  soft-tissue]
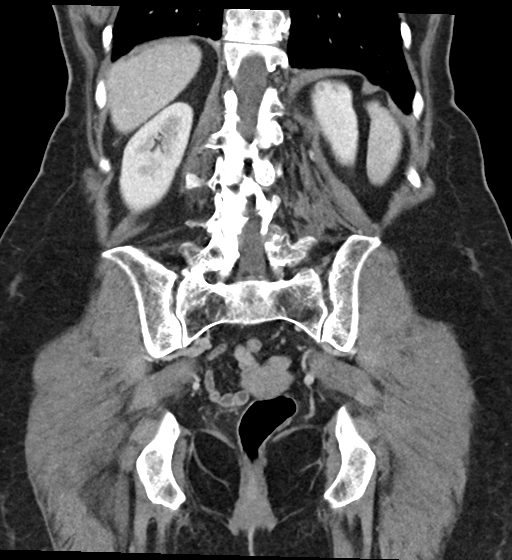
[im 39/88  soft-tissue]
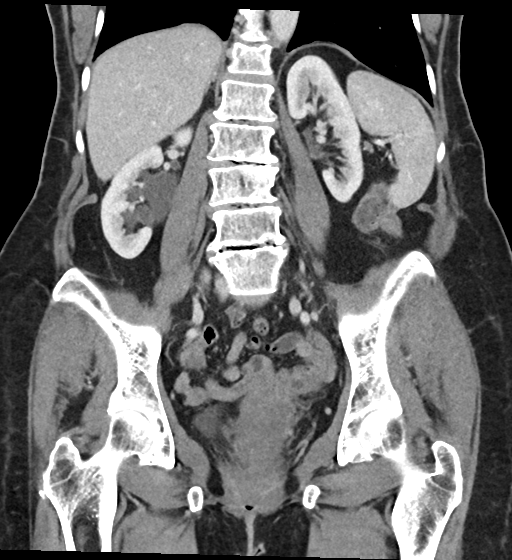
[im 49/88  soft-tissue]
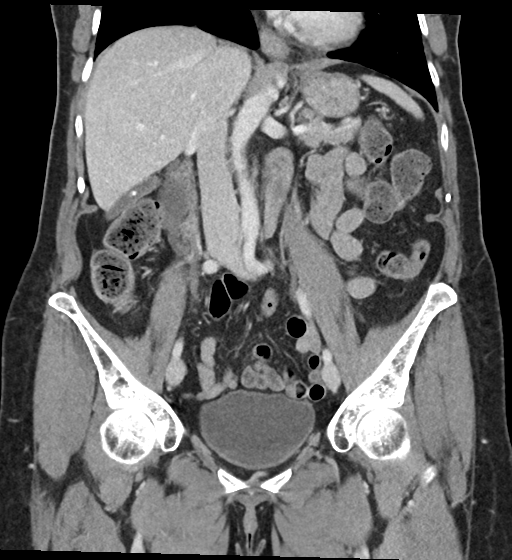

[16 of 46 positions shown; findings below may reference images not displayed]

FINDINGS: Lower chest: No acute abnormality.

Hepatobiliary: Multiple gallstones are identified without associated
pericholecystic inflammatory change. No intra or extrahepatic
biliary ductal dilation. Liver unremarkable.

Pancreas: Unremarkable

Spleen: Unremarkable

Adrenals/Urinary Tract: The adrenal glands are unremarkable. The
kidneys are normal in size and position. There is mild right
hydronephrosis and marked distension of the right renal pelvis with
decompression of the right ureter in keeping with a right UPJ
obstruction. There is symmetric renal cortical enhancement and
preserved right renal cortical thickness. No hydronephrosis on the
left. No intrarenal or ureteral calculi. The bladder is
unremarkable.

Stomach/Bowel: Stomach is within normal limits. Appendix appears
normal. No evidence of bowel wall thickening, distention, or
inflammatory changes. No free intraperitoneal gas.

Vascular/Lymphatic: No significant vascular findings are present. No
enlarged abdominal or pelvic lymph nodes.

Reproductive: Uterus and bilateral adnexa are unremarkable.

Other: No abdominal wall hernia or abnormality. No abdominopelvic
ascites.

Musculoskeletal: Remote appearing T12 superior endplate fracture is
present with approximately 20-30% loss of height. Degenerative
changes are seen within the lumbar spine. No lytic or blastic bone
lesions.
IMPRESSION: No acute intra-abdominal pathology identified. No definite
radiographic explanation for the patient's reported symptoms.

Cholelithiasis.

Mild right UPJ obstruction. Preserved right renal cortical thickness
and symmetric right renal cortical enhancement.

Remote T12 superior endplate fracture.  No acute bone abnormality.

## 2023-02-25 DIAGNOSIS — R87810 Cervical high risk human papillomavirus (HPV) DNA test positive: Secondary | ICD-10-CM | POA: Diagnosis not present

## 2023-02-25 DIAGNOSIS — Z8742 Personal history of other diseases of the female genital tract: Secondary | ICD-10-CM | POA: Diagnosis not present

## 2023-03-17 IMAGING — US US RENAL
1 series · 14 of 25 positions shown · non-contrast
Comparison: CT abdomen and pelvis dated May 31, 2021.

CLINICAL DATA: UPJ obstruction

EXAM:
RENAL / URINARY TRACT ULTRASOUND COMPLETE

[Series 1: us renal · 0.20mm/px · 14 of 42 slices shown]
[im 1/42]
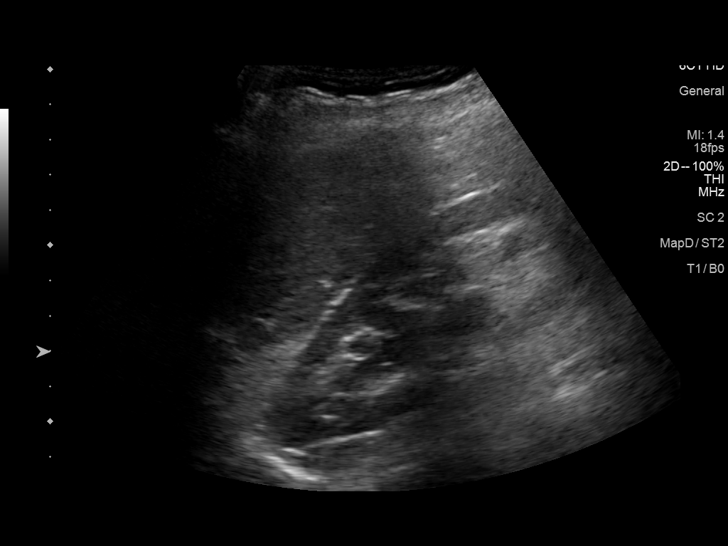
[im 4/42]
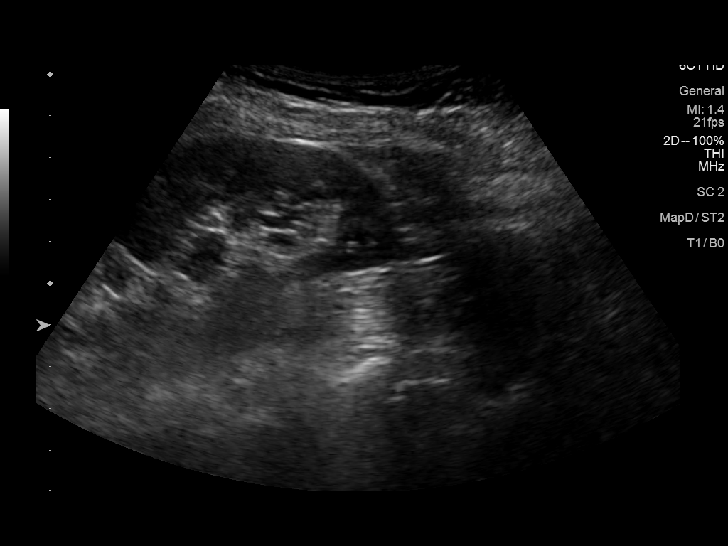
[im 7/42]
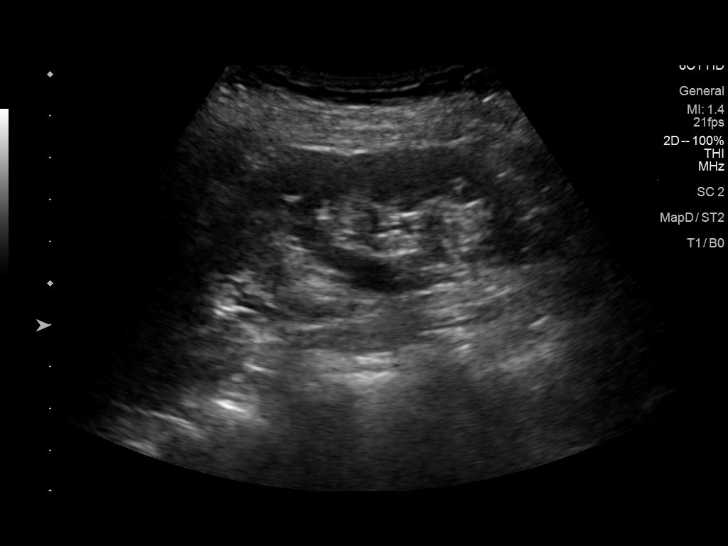
[im 11/42]
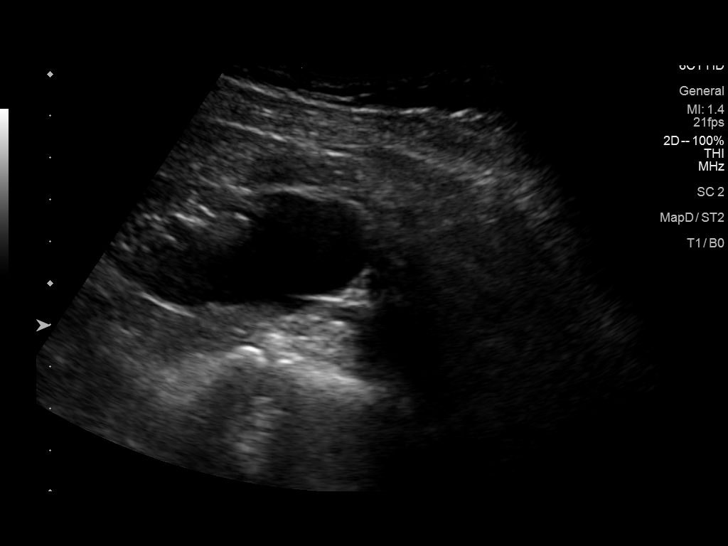
[im 14/42]
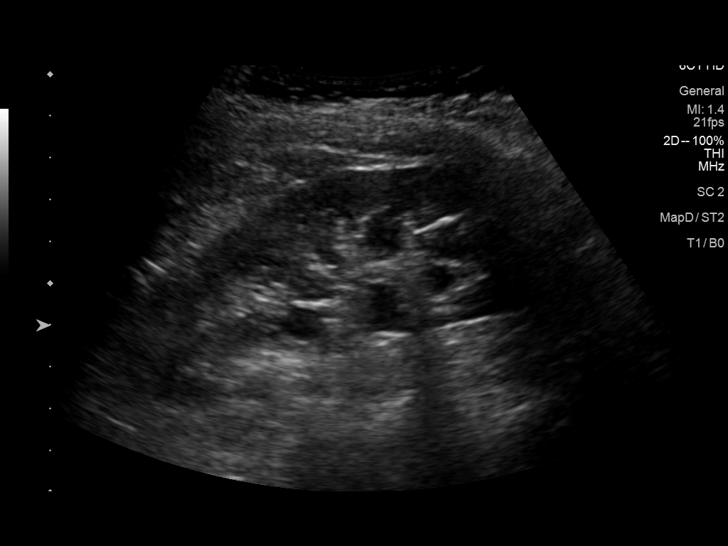
[im 16/42]
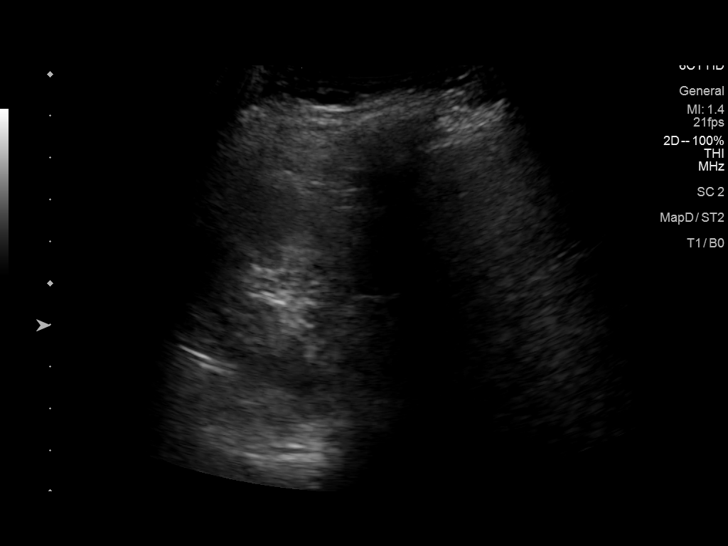
[im 19/42]
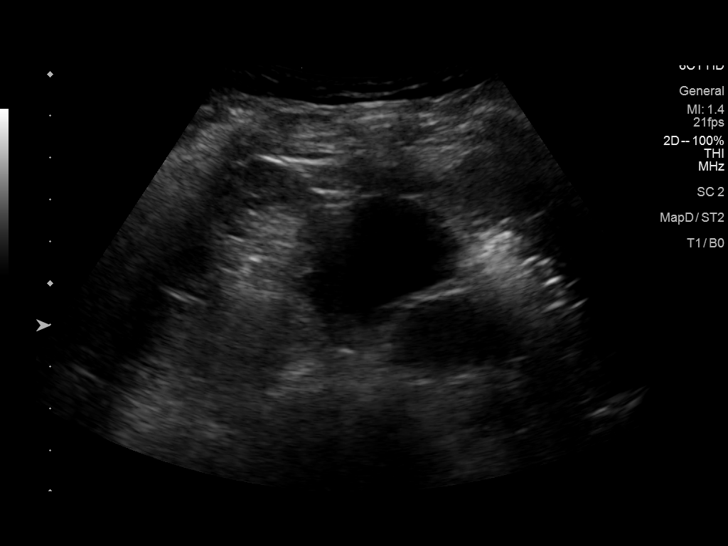
[im 23/42]
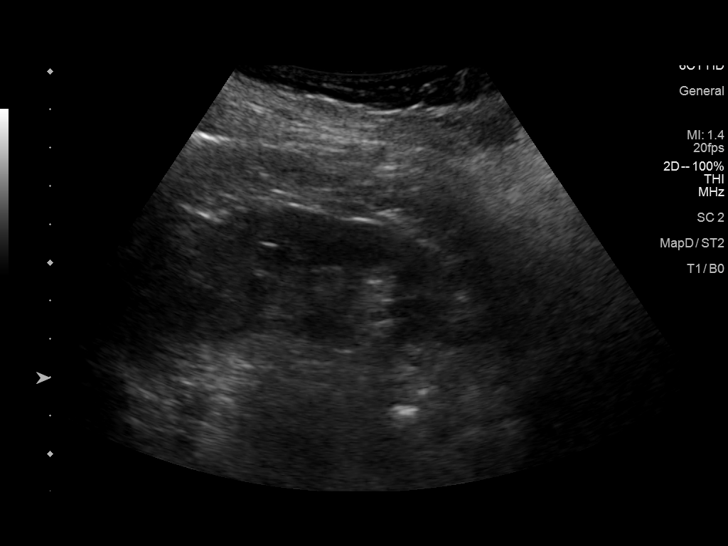
[im 26/42]
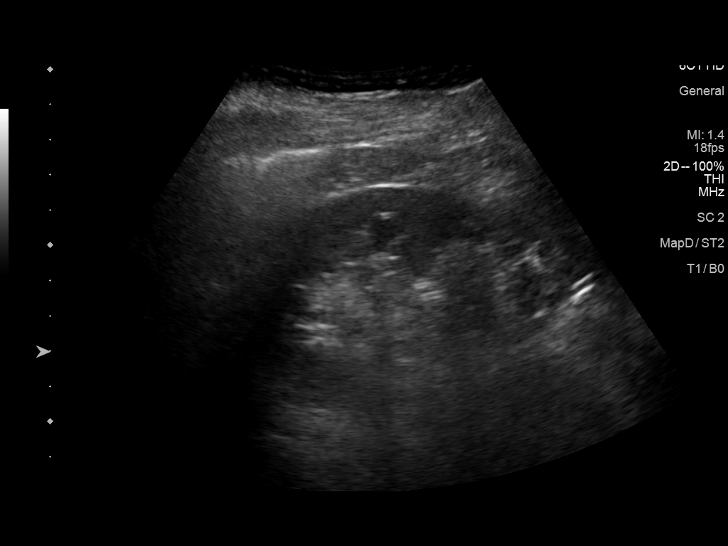
[im 28/42]
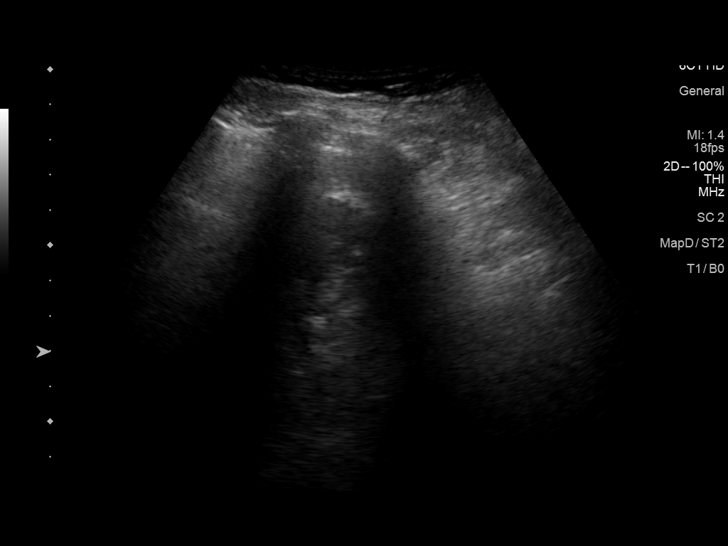
[im 31/42]
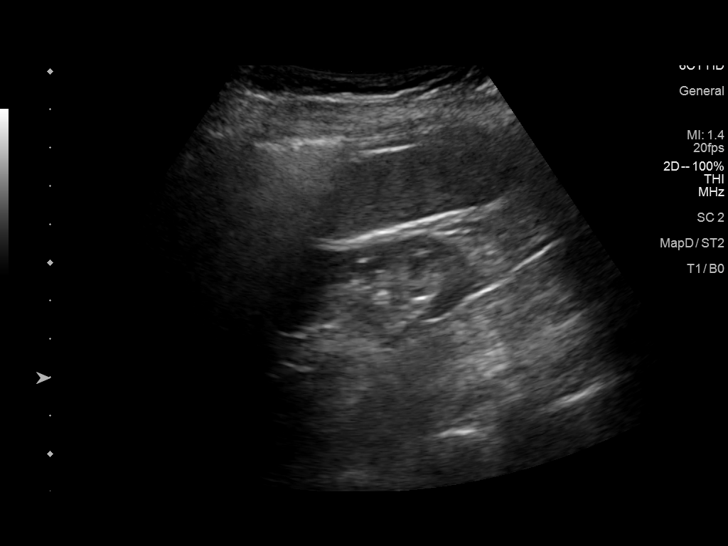
[im 35/42]
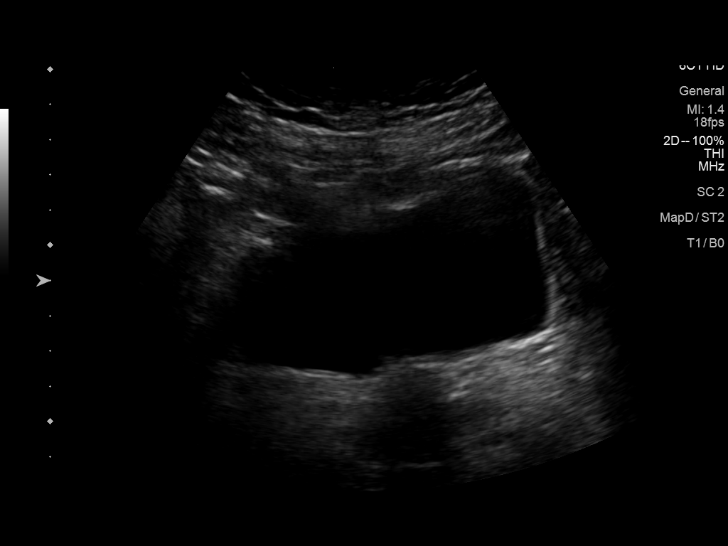
[im 38/42]
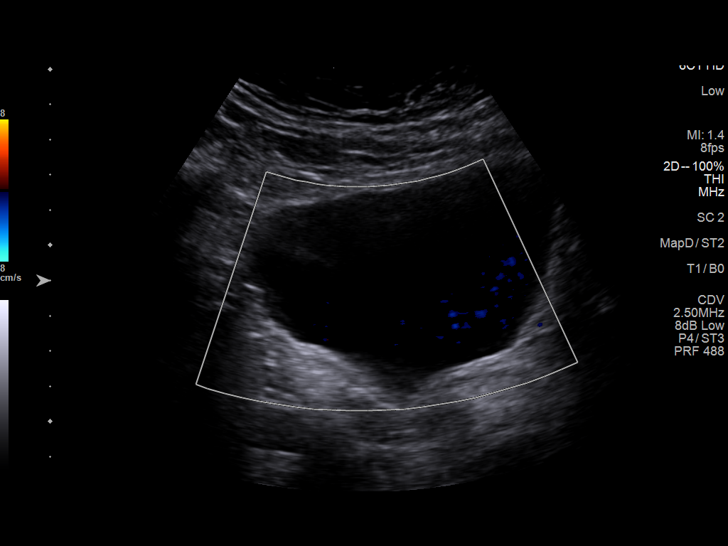
[im 42/42]
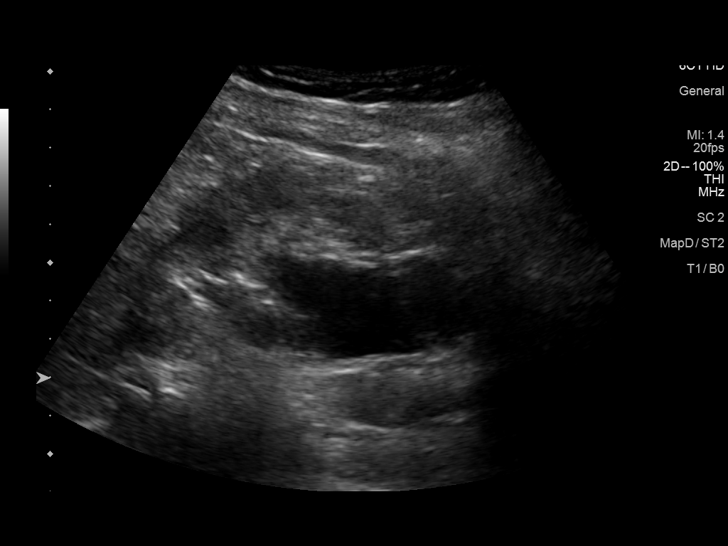

[14 of 25 positions shown; findings below may reference images not displayed]

FINDINGS: Right Kidney:

Renal measurements: 10.9 x 4.4 x 4.1 cm = volume: 102 mL.
Echogenicity within normal limits. Mild right hydronephrosis and
right proximal ureter dilation.

Left Kidney:

Renal measurements: 10.7 x 5.4 x 4.2 cm = volume: 128 mL.
Echogenicity within normal limits. No mass or hydronephrosis
visualized.

Bladder:

Appears normal for degree of bladder distention.

Other:

None.
IMPRESSION: Mild right hydronephrosis and right proximal ureter dilation,
compatible with history of UPJ obstruction.

## 2023-03-22 DIAGNOSIS — L814 Other melanin hyperpigmentation: Secondary | ICD-10-CM | POA: Diagnosis not present

## 2023-03-22 DIAGNOSIS — L578 Other skin changes due to chronic exposure to nonionizing radiation: Secondary | ICD-10-CM | POA: Diagnosis not present

## 2023-03-22 DIAGNOSIS — L821 Other seborrheic keratosis: Secondary | ICD-10-CM | POA: Diagnosis not present

## 2023-03-22 DIAGNOSIS — D225 Melanocytic nevi of trunk: Secondary | ICD-10-CM | POA: Diagnosis not present

## 2023-05-17 DIAGNOSIS — H43392 Other vitreous opacities, left eye: Secondary | ICD-10-CM | POA: Diagnosis not present

## 2023-05-17 DIAGNOSIS — H43811 Vitreous degeneration, right eye: Secondary | ICD-10-CM | POA: Diagnosis not present

## 2023-05-17 DIAGNOSIS — H2513 Age-related nuclear cataract, bilateral: Secondary | ICD-10-CM | POA: Diagnosis not present

## 2023-06-30 DIAGNOSIS — K080 Exfoliation of teeth due to systemic causes: Secondary | ICD-10-CM | POA: Diagnosis not present

## 2023-08-02 DIAGNOSIS — H1013 Acute atopic conjunctivitis, bilateral: Secondary | ICD-10-CM | POA: Diagnosis not present

## 2023-08-02 DIAGNOSIS — H2513 Age-related nuclear cataract, bilateral: Secondary | ICD-10-CM | POA: Diagnosis not present

## 2023-09-06 DIAGNOSIS — J069 Acute upper respiratory infection, unspecified: Secondary | ICD-10-CM | POA: Diagnosis not present

## 2023-09-06 DIAGNOSIS — R051 Acute cough: Secondary | ICD-10-CM | POA: Diagnosis not present

## 2023-09-06 DIAGNOSIS — R0981 Nasal congestion: Secondary | ICD-10-CM | POA: Diagnosis not present

## 2023-09-20 DIAGNOSIS — K080 Exfoliation of teeth due to systemic causes: Secondary | ICD-10-CM | POA: Diagnosis not present

## 2023-11-05 DIAGNOSIS — E559 Vitamin D deficiency, unspecified: Secondary | ICD-10-CM | POA: Diagnosis not present

## 2023-11-05 DIAGNOSIS — Z0189 Encounter for other specified special examinations: Secondary | ICD-10-CM | POA: Diagnosis not present

## 2023-11-12 DIAGNOSIS — Z1331 Encounter for screening for depression: Secondary | ICD-10-CM | POA: Diagnosis not present

## 2023-11-12 DIAGNOSIS — Z1339 Encounter for screening examination for other mental health and behavioral disorders: Secondary | ICD-10-CM | POA: Diagnosis not present

## 2023-11-12 DIAGNOSIS — M549 Dorsalgia, unspecified: Secondary | ICD-10-CM | POA: Diagnosis not present

## 2023-11-12 DIAGNOSIS — R82998 Other abnormal findings in urine: Secondary | ICD-10-CM | POA: Diagnosis not present

## 2023-11-12 DIAGNOSIS — Z0000 Encounter for general adult medical examination without abnormal findings: Secondary | ICD-10-CM | POA: Diagnosis not present

## 2023-12-28 DIAGNOSIS — K080 Exfoliation of teeth due to systemic causes: Secondary | ICD-10-CM | POA: Diagnosis not present

## 2024-01-25 DIAGNOSIS — R87810 Cervical high risk human papillomavirus (HPV) DNA test positive: Secondary | ICD-10-CM | POA: Diagnosis not present

## 2024-01-25 DIAGNOSIS — Z01419 Encounter for gynecological examination (general) (routine) without abnormal findings: Secondary | ICD-10-CM | POA: Diagnosis not present

## 2024-02-14 DIAGNOSIS — Z1231 Encounter for screening mammogram for malignant neoplasm of breast: Secondary | ICD-10-CM | POA: Diagnosis not present

## 2024-03-20 DIAGNOSIS — L570 Actinic keratosis: Secondary | ICD-10-CM | POA: Diagnosis not present

## 2024-03-20 DIAGNOSIS — D225 Melanocytic nevi of trunk: Secondary | ICD-10-CM | POA: Diagnosis not present

## 2024-03-20 DIAGNOSIS — L820 Inflamed seborrheic keratosis: Secondary | ICD-10-CM | POA: Diagnosis not present

## 2024-03-20 DIAGNOSIS — L814 Other melanin hyperpigmentation: Secondary | ICD-10-CM | POA: Diagnosis not present

## 2024-03-20 DIAGNOSIS — L821 Other seborrheic keratosis: Secondary | ICD-10-CM | POA: Diagnosis not present

## 2024-04-03 DIAGNOSIS — M7061 Trochanteric bursitis, right hip: Secondary | ICD-10-CM | POA: Diagnosis not present

## 2024-05-03 DIAGNOSIS — M1611 Unilateral primary osteoarthritis, right hip: Secondary | ICD-10-CM | POA: Diagnosis not present

## 2024-05-13 DIAGNOSIS — M25551 Pain in right hip: Secondary | ICD-10-CM | POA: Diagnosis not present
